# Patient Record
Sex: Male | Born: 1960 | Race: Black or African American | Hispanic: No | Marital: Single | Smoking: Never smoker
Health system: Southern US, Community
[De-identification: ages and names within clinical notes are randomized; demographics above are authoritative.]

## PROBLEM LIST (undated history)

## (undated) DIAGNOSIS — J45909 Unspecified asthma, uncomplicated: Secondary | ICD-10-CM

## (undated) DIAGNOSIS — I429 Cardiomyopathy, unspecified: Secondary | ICD-10-CM

## (undated) DIAGNOSIS — I10 Essential (primary) hypertension: Secondary | ICD-10-CM

---

## 2016-01-25 ENCOUNTER — Encounter (HOSPITAL_COMMUNITY): Payer: Self-pay

## 2016-01-25 ENCOUNTER — Emergency Department (HOSPITAL_COMMUNITY)

## 2016-01-25 ENCOUNTER — Observation Stay (HOSPITAL_COMMUNITY)
Admission: EM | Admit: 2016-01-25 | Discharge: 2016-01-27 | Disposition: A | Discharge status: Home / Self Care | Attending: Internal Medicine | Admitting: Internal Medicine

## 2016-01-25 DIAGNOSIS — R079 Chest pain, unspecified: Secondary | ICD-10-CM | POA: Diagnosis present

## 2016-01-25 DIAGNOSIS — I209 Angina pectoris, unspecified: Principal | ICD-10-CM | POA: Insufficient documentation

## 2016-01-25 DIAGNOSIS — I10 Essential (primary) hypertension: Secondary | ICD-10-CM | POA: Insufficient documentation

## 2016-01-25 DIAGNOSIS — R778 Other specified abnormalities of plasma proteins: Secondary | ICD-10-CM

## 2016-01-25 DIAGNOSIS — J45909 Unspecified asthma, uncomplicated: Secondary | ICD-10-CM | POA: Diagnosis not present

## 2016-01-25 DIAGNOSIS — R7989 Other specified abnormal findings of blood chemistry: Secondary | ICD-10-CM | POA: Diagnosis not present

## 2016-01-25 HISTORY — DX: Unspecified asthma, uncomplicated: J45.909

## 2016-01-25 HISTORY — DX: Cardiomyopathy, unspecified: I42.9

## 2016-01-25 HISTORY — DX: Essential (primary) hypertension: I10

## 2016-01-25 LAB — CBC
HEMATOCRIT: 46.3 % (ref 39.0–52.0)
Hemoglobin: 15.1 g/dL (ref 13.0–17.0)
MCH: 27.5 pg (ref 26.0–34.0)
MCHC: 32.6 g/dL (ref 30.0–36.0)
MCV: 84.2 fL (ref 78.0–100.0)
Platelets: 159 10*3/uL (ref 150–400)
RBC: 5.5 MIL/uL (ref 4.22–5.81)
RDW: 13.8 % (ref 11.5–15.5)
WBC: 4.8 10*3/uL (ref 4.0–10.5)

## 2016-01-25 LAB — COMPREHENSIVE METABOLIC PANEL
ALT: 17 U/L (ref 17–63)
AST: 25 U/L (ref 15–41)
Albumin: 4.5 g/dL (ref 3.5–5.0)
Alkaline Phosphatase: 46 U/L (ref 38–126)
Anion gap: 6 (ref 5–15)
BILIRUBIN TOTAL: 0.5 mg/dL (ref 0.3–1.2)
BUN: 17 mg/dL (ref 6–20)
CO2: 28 mmol/L (ref 22–32)
CREATININE: 1.29 mg/dL — AB (ref 0.61–1.24)
Calcium: 9.4 mg/dL (ref 8.9–10.3)
Chloride: 105 mmol/L (ref 101–111)
GFR calc Af Amer: >60 mL/min (ref >60)
Glucose, Bld: 97 mg/dL (ref 65–99)
Potassium: 4.6 mmol/L (ref 3.5–5.1)
Sodium: 139 mmol/L (ref 135–145)
TOTAL PROTEIN: 7.2 g/dL (ref 6.5–8.1)

## 2016-01-25 LAB — TROPONIN I: TROPONIN I: 0.08 ng/mL — AB (ref <0.031)

## 2016-01-25 MED ORDER — NITROGLYCERIN 0.4 MG SL SUBL
0.4000 mg | SUBLINGUAL_TABLET | Freq: Once | SUBLINGUAL | Status: AC
  Administered 2016-01-25: 0.4 mg via SUBLINGUAL
  Filled 2016-01-25: qty 1

## 2016-01-25 MED ORDER — NITROGLYCERIN 0.4 MG SL SUBL: 0.4000 mg | SUBLINGUAL_TABLET | Freq: Once | SUBLINGUAL | Status: DC

## 2016-01-25 MED ORDER — FENTANYL CITRATE (PF) 100 MCG/2ML IJ SOLN
100.0000 ug | Freq: Once | INTRAMUSCULAR | Status: AC
  Administered 2016-01-25: 100 ug via INTRAVENOUS
  Filled 2016-01-25: qty 2

## 2016-01-25 NOTE — H&P (Signed)
PCP:   No PCP Per Patient   Chief Complaint:  Chest pain  HPI: 55 year old male who   has a past medical history of Hypertension; Cardiomyopathy (HCC); and Asthma. patient is currently in prison. Today was brought to the hospital for chest pain which started on Wednesday. Patient says that the pain was continuous and this morning it became worse and felt like somebody was sitting on the chest. He also reported radiation to the left arm. Denies nausea vomiting or diarrhea. No shortness of breath. In the ED pain relieved after patient got fentanyl. Denies fever, no dysuria or urgency frequency of urination. Patient says he has a history of cardiopathy also had cardiac cath done 10 years ago which was normal. In the ED he was found to have mild elevation of troponin 0.08, cardiology fellow at [facility] was consulted by the ED physician. And he recommended to keep the patient at [facility] at this time. No heparin or Lovenox recommended.  Allergies:  No Known Allergies    Past Medical History  Diagnosis Date  . Hypertension   . Cardiomyopathy (HCC)   . Asthma     History reviewed. No pertinent past surgical history.  Prior to Admission medications   Medication Sig Start Date End Date Taking? Authorizing Provider  albuterol (PROVENTIL HFA;VENTOLIN HFA) 108 (90 Base) MCG/ACT inhaler Inhale 1 puff into the lungs every 6 (six) hours as needed for wheezing or shortness of breath.   Yes Historical Provider, MD  amLODipine (NORVASC) 5 MG tablet Take 5 mg by mouth daily.   Yes Historical Provider, MD  beclomethasone (QVAR) 40 MCG/ACT inhaler Inhale 1 puff into the lungs 2 (two) times daily.   Yes Historical Provider, MD    Social History:  reports that he has never smoked. He does not have any smokeless tobacco history on file. He reports that he does not drink alcohol or use illicit drugs.  Patient's mother and sister has history of cancer. No history of CAD in family  Filed  Weights   01/25/16 2008  Weight: 88.451 kg (195 lb)    All the positives are listed in BOLD  Review of Systems:  HEENT: Headache, blurred vision, runny nose, sore throat Neck: Hypothyroidism, hyperthyroidism,,lymphadenopathy Chest : Shortness of breath, history of COPD, Asthma Heart : Chest pain, history of coronary arterey disease GI:  Nausea, vomiting, diarrhea, constipation, GERD GU: Dysuria, urgency, frequency of urination, hematuria Neuro: Stroke, seizures, syncope Psych: Depression, anxiety, hallucinations   Physical Exam: Blood pressure 130/88, pulse 52, temperature 97.8 F (36.6 C), temperature source Oral, resp. rate 13, height  (1.753 m), weight 88.451 kg (195 lb), SpO2 99 %. Constitutional:   Patient is a well-developed and well-nourished male in no acute distress and cooperative with exam. Head: Normocephalic and atraumatic Mouth: Mucus membranes moist Eyes: PERRL, EOMI, conjunctivae normal Neck: Supple, No Thyromegaly Cardiovascular: RRR, S1 normal, S2 normal Pulmonary/Chest: CTAB, no wheezes, rales, or rhonchi Abdominal: Soft. Non-tender, non-distended, bowel sounds are normal, no masses, organomegaly, or guarding present.  Neurological: A&O x3, Strength is normal and symmetric bilaterally, cranial nerve II-XII are grossly intact, no focal motor deficit, sensory intact to light touch bilaterally.  Extremities : No Cyanosis, Clubbing or Edema  Labs on Admission:  Basic Metabolic Panel:  Recent Labs Lab 01/25/16 2020  NA 139  K 4.6  CL 105  CO2 28  GLUCOSE 97  BUN 17  CREATININE 1.29*  CALCIUM 9.4   Liver Function Tests:  Recent Labs Lab 01/25/16 2020  AST 25  ALT 17  ALKPHOS 46  BILITOT 0.5  PROT 7.2  ALBUMIN 4.5    CBC:  Recent Labs Lab 01/25/16 2020  WBC 4.8  HGB 15.1  HCT 46.3  MCV 84.2  PLT 159   Cardiac Enzymes:  Recent Labs Lab 01/25/16 2020  TROPONINI 0.08*     Radiological Exams on Admission: Dg Chest 2  View  01/25/2016  CLINICAL DATA:  Chest pain EXAM: CHEST  2 VIEW COMPARISON:  None. FINDINGS: The lungs are clear wiithout focal pneumonia, edema, pneumothorax or pleural effusion. Cardiopericardial silhouette is at upper limits of normal for size. The visualized bony structures of the thorax are intact. Telemetry leads overlie the chest. IMPRESSION: No active cardiopulmonary disease. Electronically Signed   By: Kennith CenterEric  Mansell M.D.   On: 01/25/2016 21:07    EKG: Independently reviewed. P-wave inversions in 2, 3 aVF, V4 V5 V6   Assessment/Plan Active Problems:   Chest pain   Chest pain Admit the patient under observation in telemetry Cycle cardiac enzymes every 6 hours 3 Start Nitropaste half-inch every 6 hours Consult cardiology in a.m. Start aspirin 325 mg by mouth daily Start morphine when necessary for pain  DVT prophylaxis Lovenox  Code status: Full code  Family discussion: No family at bedside   Time Spent on Admission: 60 min  [facility],Liann Spaeth S [facility] Pager: [phone] 01/25/2016, 11:33 PM  If 7PM-7AM, please contact night-coverage  [email or web address]  Password [email or web address]

## 2016-01-25 NOTE — ED Notes (Signed)
[facility] that started Wednesday. Patient reports of pain in left chest wall that radiates into left upper arm. EMS states patient was given 324 of baby ASA and 2 Nitro with no relief.

## 2016-01-25 NOTE — ED Notes (Signed)
MD at bedside. 

## 2016-01-25 NOTE — ED Provider Notes (Signed)
CSN: [id number]     Arrival date & time 01/25/16  2003 History  By signing my name below, I, Marisue Humble, attest that this documentation has been prepared under the direction and in the presence of Lavera Guise, MD . Electronically Signed: Marisue Humble, Scribe. 01/25/2016. 8:46 PM.   Chief Complaint  Patient presents with  . Chest Pain   The history is provided by the patient. No language interpreter was used.   HPI Comments:  Jeffrey Petersen is a 55 y.o. male with PMHx of cardiomyopathy, HTN, and ashtma who presents to the Emergency Department via EMS complaining of gradual onset, intermittent, moderate chest pain for the past 5 days, worsening yesterday. He states he woke up this morning feeling like someone was sitting on his chest. Pt also reports radiation down his left arm feeling like needles. He also notes he feels like there's fluid around his heart. He states he doesn't have any difficulties when exercising, but hasn't been moving around for the past few days because he has been in solitary. Pt notes pain is improved slightly when sitting up. Pt denies h/o smoking or FHx of heart problems. Denies nausea, dizziness, shortness of breath, diaphoresis, vomiting, diarrhea, abdominal pain, cough, fever, leg swelling, or leg pain.  Past Medical History  Diagnosis Date  . Hypertension   . Cardiomyopathy (HCC)   . Asthma    History reviewed. No pertinent past surgical history. History reviewed. No pertinent family history. Social History  Substance Use Topics  . Smoking status: Never Smoker   . Smokeless tobacco: None  . Alcohol Use: No    Review of Systems  Constitutional: Negative for fever and diaphoresis.  Respiratory: Negative for cough and shortness of breath.   Cardiovascular: Positive for chest pain. Negative for leg swelling.  Gastrointestinal: Negative for nausea, vomiting, abdominal pain and diarrhea.  Musculoskeletal: Positive for arthralgias (left arm).   Neurological: Negative for dizziness.  All other systems reviewed and are negative.  Allergies  Review of patient's allergies indicates no known allergies.  Home Medications   Prior to Admission medications   Medication Sig Start Date End Date Taking? Authorizing Provider  albuterol (PROVENTIL HFA;VENTOLIN HFA) 108 (90 Base) MCG/ACT inhaler Inhale 1 puff into the lungs every 6 (six) hours as needed for wheezing or shortness of breath.   Yes Historical Provider, MD  amLODipine (NORVASC) 5 MG tablet Take 5 mg by mouth daily.   Yes Historical Provider, MD  beclomethasone (QVAR) 40 MCG/ACT inhaler Inhale 1 puff into the lungs 2 (two) times daily.   Yes Historical Provider, MD   BP 128/84 mmHg  Pulse 58  Temp(Src) 97.8 F (36.6 C) (Oral)  Resp 18  Ht  (1.753 m)  Wt 195 lb (88.451 kg)  BMI 28.78 kg/m2  SpO2 97%   Physical Exam Physical Exam  Nursing note and vitals reviewed. Constitutional: Well developed, well nourished, non-toxic, and in no acute distress Head: Normocephalic and atraumatic.  Mouth/Throat: Oropharynx is clear and moist.  Neck: Normal range of motion. Neck supple.  Cardiovascular: Normal rate and regular rhythm.    Pulmonary/Chest: Effort normal and breath sounds normal.  Abdominal: Soft. There is no tenderness. There is no rebound and no guarding.  Musculoskeletal: Normal range of motion. No LE edema. Neurological: Alert, no facial droop, fluent speech, moves all extremities symmetrically Skin: Skin is warm and dry.  Psychiatric: Cooperative  ED Course  Procedures  DIAGNOSTIC STUDIES: Oxygen Saturation is 99% on RA, normal by my  interpretation.    COORDINATION OF CARE: 8:27 PM Will order chest x-ray and blood work. Discussed treatment plan with pt at bedside and pt agreed to plan.  Labs Review Labs Reviewed  COMPREHENSIVE METABOLIC PANEL - Abnormal; Notable for the following:    Creatinine, Ser 1.29 (*)    All other components within normal limits   TROPONIN I - Abnormal; Notable for the following:    Troponin I 0.08 (*)    All other components within normal limits  CBC    Imaging Review Dg Chest 2 View  01/25/2016  CLINICAL DATA:  Chest pain EXAM: CHEST  2 VIEW COMPARISON:  None. FINDINGS: The lungs are clear wiithout focal pneumonia, edema, pneumothorax or pleural effusion. Cardiopericardial silhouette is at upper limits of normal for size. The visualized bony structures of the thorax are intact. Telemetry leads overlie the chest. IMPRESSION: No active cardiopulmonary disease. Electronically Signed   By: Kennith CenterEric  Mansell M.D.   On: 01/25/2016 21:07   I have personally reviewed and evaluated these images and lab results as part of my medical decision-making.   EKG Interpretation   Date/Time:  Sunday January 25 2016 20:07:49 EDT Ventricular Rate:  58 PR Interval:  220 QRS Duration: 110 QT Interval:  436 QTC Calculation: 428 R Axis:   117 Text Interpretation:  Sinus rhythm Prolonged PR interval LAE, consider  biatrial enlargement Probable RVH w/ secondary repol abnormality Inferior  infarct, age indeterminate Abnormal lateral Q waves No old tracing to  compare Confirmed by [facility]  MD, ELLIOTT [phone]([id number]) on 01/25/2016 8:15:38 PM      MDM   Final diagnoses:  Ischemic chest pain (HCC)  Troponin level elevated   55 year old male with history of hypertrophic cardiomyopathy and hypertension who presents with chest pressure. On arrival had received a full dose of aspirin and 2 sublingual nitroglycerin with persistent chest pressure. His vital signs are stable. His EKG shows Q waves in the inferolateral leads with T-wave inversions inferolaterally. This may be consistent with hypertrophic cardiomyopathy, and he states that he's been told that he has had these EKG findings in the past. No prior EKG for comparison in our system. His cardiopulmonary exam is unremarkable and the remainder of exam is also unremarkable. He has negative chest x-ray.  Troponin is mildly elevated at 0.08. Concern for ischemic etiology of chest pain. His chest pain did fully resolve after third dose of nitroglycerin with 100 g of fentanyl. [facility] T[facility] End[facility]Kelly [facility], who [facility] for serial troponins and cardiology consult. Did not recommend heparin drip at this time. Subsequently discussed with Dr. Sharl MaLama will admit for ongoing management.  I personally performed the services described in this documentation, which was scribed in my presence. The recorded information has been reviewed and is accurate.    Lavera Guiseana Duo Monice Lundy, MD 01/26/16 [phone]

## 2016-01-25 NOTE — ED Notes (Signed)
Returned from xray

## 2016-01-25 NOTE — ED Notes (Signed)
Coke given to patient as requested by patient.

## 2016-01-26 ENCOUNTER — Observation Stay (HOSPITAL_BASED_OUTPATIENT_CLINIC_OR_DEPARTMENT_OTHER)

## 2016-01-26 ENCOUNTER — Encounter (HOSPITAL_COMMUNITY): Payer: Self-pay | Admitting: *Deleted

## 2016-01-26 DIAGNOSIS — I10 Essential (primary) hypertension: Secondary | ICD-10-CM | POA: Diagnosis not present

## 2016-01-26 DIAGNOSIS — R079 Chest pain, unspecified: Secondary | ICD-10-CM

## 2016-01-26 LAB — TROPONIN I
TROPONIN I: 0.08 ng/mL — AB (ref <0.031)
TROPONIN I: 0.07 ng/mL — AB (ref <0.031)
TROPONIN I: 0.07 ng/mL — AB (ref <0.031)

## 2016-01-26 LAB — BRAIN NATRIURETIC PEPTIDE: B Natriuretic Peptide: 65 pg/mL (ref 0.0–100.0)

## 2016-01-26 LAB — ECHOCARDIOGRAM COMPLETE
Height: 66 in
Weight: 3068.8 oz

## 2016-01-26 LAB — MRSA PCR SCREENING: MRSA by PCR: NEGATIVE

## 2016-01-26 LAB — D-DIMER, QUANTITATIVE (NOT AT ARMC): D DIMER QUANT: 0.38 ug{FEU}/mL (ref 0.00–0.50)

## 2016-01-26 MED ORDER — ASPIRIN EC 325 MG PO TBEC
325.0000 mg | DELAYED_RELEASE_TABLET | Freq: Every day | ORAL | Status: DC
  Administered 2016-01-26 – 2016-01-27 (×2): 325 mg via ORAL
  Filled 2016-01-26 (×2): qty 1

## 2016-01-26 MED ORDER — BUDESONIDE 0.25 MG/2ML IN SUSP
0.2500 mg | Freq: Two times a day (BID) | RESPIRATORY_TRACT | Status: DC
  Administered 2016-01-26 – 2016-01-27 (×4): 0.25 mg via RESPIRATORY_TRACT
  Filled 2016-01-26 (×4): qty 2

## 2016-01-26 MED ORDER — BECLOMETHASONE DIPROPIONATE 40 MCG/ACT IN AERS
1.0000 | INHALATION_SPRAY | Freq: Two times a day (BID) | RESPIRATORY_TRACT | Status: DC
  Filled 2016-01-26: qty 8.7

## 2016-01-26 MED ORDER — ACETAMINOPHEN 325 MG PO TABS
650.0000 mg | ORAL_TABLET | ORAL | Status: DC | PRN
  Administered 2016-01-26 – 2016-01-27 (×4): 650 mg via ORAL
  Filled 2016-01-26 (×4): qty 2

## 2016-01-26 MED ORDER — BECLOMETHASONE DIPROPIONATE 40 MCG/ACT IN AERS: 1.0000 | INHALATION_SPRAY | Freq: Two times a day (BID) | RESPIRATORY_TRACT | Status: DC

## 2016-01-26 MED ORDER — ONDANSETRON HCL 4 MG/2ML IJ SOLN: 4.0000 mg | Freq: Four times a day (QID) | INTRAMUSCULAR | Status: DC | PRN

## 2016-01-26 MED ORDER — NITROGLYCERIN 2 % TD OINT
0.5000 [in_us] | TOPICAL_OINTMENT | Freq: Four times a day (QID) | TRANSDERMAL | Status: DC
  Administered 2016-01-26 – 2016-01-27 (×6): 0.5 [in_us] via TOPICAL
  Filled 2016-01-26 (×5): qty 1

## 2016-01-26 MED ORDER — MORPHINE SULFATE (PF) 2 MG/ML IV SOLN
2.0000 mg | INTRAVENOUS | Status: DC | PRN
Start: 2016-01-26 — End: 2016-01-27
  Administered 2016-01-26: 2 mg via INTRAVENOUS
  Filled 2016-01-26: qty 1

## 2016-01-26 MED ORDER — ENOXAPARIN SODIUM 40 MG/0.4ML ~~LOC~~ SOLN
40.0000 mg | SUBCUTANEOUS | Status: DC
  Administered 2016-01-26 – 2016-01-27 (×2): 40 mg via SUBCUTANEOUS
  Filled 2016-01-26 (×2): qty 0.4

## 2016-01-26 MED ORDER — AMLODIPINE BESYLATE 5 MG PO TABS
5.0000 mg | ORAL_TABLET | Freq: Every day | ORAL | Status: DC
  Administered 2016-01-26 – 2016-01-27 (×2): 5 mg via ORAL
  Filled 2016-01-26 (×2): qty 1

## 2016-01-26 NOTE — Progress Notes (Signed)
[facility] PROGRESS NOTE  Cathren HarshClark XXXWitherspoon NWG:[id number] DOB: 1961/02/03 DOA: 01/25/2016 PCP: No PCP Per Patient  Assessment/Plan: Chest Pain -CP is somewhat atypical. He does have inferior q waves and lateral ST depressions on EKG. -He states he has a h/o HTN and maybe obstructive cardiomyopathy. -ECHO pending. -Cardiology following and they will determine if further testing is required.  HTN -Well controlled on norvasc.  H/o Asthma -Does not appear to be exacerbated at this time.    Code Status: Full Code Family Communication: Patient only  Disposition Plan: to be determined   Consultants:  Cardiology   Antibiotics:  None   Subjective: Had some chest discomfort today  Objective: Filed Vitals:   01/26/16 0550 01/26/16 0812 01/26/16 1151 01/26/16 1500  BP: 128/82  122/67 136/70  Pulse: 61  67 61  Temp: 97.5 F (36.4 C)  98.6 F (37 C) 98.7 F (37.1 C)  TempSrc: Oral  Oral Oral  Resp: 18  18 18   Height:      Weight:      SpO2: 97% 96% 100% 99%    Intake/Output Summary (Last 24 hours) at 01/26/16 1703 Last data filed at 01/26/16 1230  Gross per 24 hour  Intake    480 ml  Output      0 ml  Net    480 ml   Filed Weights   01/25/16 2008 01/26/16 0021  Weight: 88.451 kg (195 lb) 87 kg (191 lb 12.8 oz)    Exam:   General:  AA Ox3  Cardiovascular: RRR  Respiratory: CTA B  Abdomen: S/NT/ND/+BS  Extremities: no C/C/E   Neurologic:  Non-focal  Data Reviewed: Basic Metabolic Panel:  Recent Labs Lab 01/25/16 2020  NA 139  K 4.6  CL 105  CO2 28  GLUCOSE 97  BUN 17  CREATININE 1.29*  CALCIUM 9.4   Liver Function Tests:  Recent Labs Lab 01/25/16 2020  AST 25  ALT 17  ALKPHOS 46  BILITOT 0.5  PROT 7.2  ALBUMIN 4.5   No results for input(s): LIPASE, AMYLASE in the last 168 hours. No results for input(s): AMMONIA in the last 168 hours. CBC:  Recent Labs Lab 01/25/16 2020  WBC 4.8  HGB 15.1  HCT 46.3    MCV 84.2  PLT 159   Cardiac Enzymes:  Recent Labs Lab 01/25/16 2020 01/26/16 0151 01/26/16 0654 01/26/16 1325  TROPONINI 0.08* 0.08* 0.07* 0.07*   BNP (last 3 results) No results for input(s): BNP in the last 8760 hours.  ProBNP (last 3 results) No results for input(s): PROBNP in the last 8760 hours.  CBG: No results for input(s): GLUCAP in the last 168 hours.  Recent Results (from the past 240 hour(s))  MRSA PCR Screening     Status: None   Collection Time: 01/26/16  1:22 AM  Result Value Ref Range Status   MRSA by PCR NEGATIVE NEGATIVE Final    Comment:        The GeneXpert MRSA Assay (FDA approved for NASAL specimens only), is one component of a comprehensive MRSA colonization surveillance program. It is not intended to diagnose MRSA infection nor to guide or monitor treatment for MRSA infections.      Studies: Dg Chest 2 View  01/25/2016  CLINICAL DATA:  Chest pain EXAM: CHEST  2 VIEW COMPARISON:  None. FINDINGS: The lungs are clear wiithout focal pneumonia, edema, pneumothorax or pleural effusion. Cardiopericardial silhouette is at upper limits of normal for size. The visualized bony  structures of the thorax are intact. Telemetry leads overlie the chest. IMPRESSION: No active cardiopulmonary disease. Electronically Signed   By: Kennith Center M.D.   On: 01/25/2016 21:07    Scheduled Meds: . amLODipine  5 mg Oral Daily  . aspirin EC  325 mg Oral Daily  . budesonide (PULMICORT) nebulizer solution  0.25 mg Nebulization BID  . enoxaparin (LOVENOX) injection  40 mg Subcutaneous Q24H  . nitroGLYCERIN  0.5 inch Topical 4 times per day  . nitroGLYCERIN  0.4 mg Sublingual Once   Continuous Infusions:   Active Problems:   Chest pain    Time spent: 25 minutes. Greater than 50% of this time was spent in direct contact with the patient coordinating care.    Chaya Jan  [facility] Pager [phone]  If 7PM-7AM, please contact night-coverage  at [email or web address], password [facility] 01/26/2016, 5:03 PM

## 2016-01-26 NOTE — Consult Note (Signed)
CARDIOLOGY CONSULT NOTE   Patient ID: Jeffrey Petersen MRN: [id number] DOB/AGE: [birth year] 54 y.o.  Admit Date: 01/25/2016 Referring Physician: [facility]-Hernandez Primary Physician: No PCP Per Patient Consulting Cardiologist: Jeffrey Pates  Primary Cardiologist New Reason for Consultation: Chest Pain with elevated troponin   Clinical Summary Jeffrey Petersen is a 55 y.o.male with known history of hypertension, hypertensive cardiomyopathy, and Asthma presented to the ER with complaints of chest pain. He has been previously seen by [facility] for his cardiomyopathy but not since 2005. He is currently incarcerated.   He states on Wednesday of last week he woke up and had some chest pressure. The pressure was constant without associated dyspnea, NV, diaphoresis or dizziness. He ignored it. By Sunday, 3.12.2017, the pressure became worse with a tightness or squeezing feeling over the left chest. One episode of left arm pain which was brief. He reported to the guards and nurse who felt he should be seen in ER.   On arrival to ER, BP 117/75, HR 56, O2 Sat 99%, afebrile. Labs demonstrate slight elevation in creatinine of 1.29, troponin of 0.08, CXR negative for CHF or pneumonia. EKG demonstrated NSR with Q-waves inferiorly and ST inversion laterally. Subsequent troponin levels 0.08;0.08; 0.07.   Currently he still has mild chest pressure    No Known Allergies  Medications Scheduled Medications: . amLODipine  5 mg Oral Daily  . aspirin EC  325 mg Oral Daily  . budesonide (PULMICORT) nebulizer solution  0.25 mg Nebulization BID  . enoxaparin (LOVENOX) injection  40 mg Subcutaneous Q24H  . nitroGLYCERIN  0.5 inch Topical 4 times per day  . nitroGLYCERIN  0.4 mg Sublingual Once    Infusions:    PRN Medications: acetaminophen, morphine injection, ondansetron (ZOFRAN) IV   Past Medical History  Diagnosis Date  . Hypertension   . Cardiomyopathy (HCC)   . Asthma     History reviewed. No  pertinent past surgical history.  Family History  Problem Relation Age of Onset  . Hypertension Mother   . Hypertension Father     Social History Jeffrey Petersen reports that he has never smoked. He does not have any smokeless tobacco history on file. Jeffrey Petersen reports that he does not drink alcohol.  Review of Systems Complete review of systems are found to be negative unless outlined in H&P above.  Physical Examination Blood pressure 128/82, pulse 61, temperature 97.5 F (36.4 C), temperature source Oral, resp. rate 18, height  (1.676 m), weight 191 lb 12.8 oz (87 kg), SpO2 96 %. No intake or output data in the 24 hours ending 01/26/16 1047  Telemetry: NSR   GEN: No acute distress but continues complaints of chest pressure HEENT: Conjunctiva and lids normal, oropharynx clear with moist mucosa. Neck: Supple, no elevated JVP or carotid bruits, no thyromegaly. Lungs: Clear to auscultation, nonlabored breathing at rest. Cardiac: Regular rate and rhythm, 1/6 soft systolic murmur,  no S3 or significant systolic murmur, no pericardial rub. Chest  Nontender Abdomen: Soft, nontender, no hepatomegaly, bowel sounds present, no guarding or rebound. Extremities: No pitting edema, distal pulses 2+. Skin: Warm and dry. Musculoskeletal: No kyphosis. Neuropsychiatric: Alert and oriented x3, affect grossly appropriate.  Prior Cardiac Testing/Procedures Has been followed at [facility] and had cardiac cath, echo and stress test per patient. ([facility] is not responding).  Lab Results  Basic Metabolic Panel:  Recent Labs Lab 01/25/16 2020  NA 139  K 4.6  CL 105  CO2 28  GLUCOSE 97  BUN  17  CREATININE 1.29*  CALCIUM 9.4    Liver Function Tests:  Recent Labs Lab 01/25/16 2020  AST 25  ALT 17  ALKPHOS 46  BILITOT 0.5  PROT 7.2  ALBUMIN 4.5    CBC:  Recent Labs Lab 01/25/16 2020  WBC 4.8  HGB 15.1  HCT 46.3  MCV 84.2  PLT 159    Cardiac  Enzymes:  Recent Labs Lab 01/25/16 2020 01/26/16 0151 01/26/16 0654  TROPONINI 0.08* 0.08* 0.07*    Radiology: Dg Chest 2 View  01/25/2016  CLINICAL DATA:  Chest pain EXAM: CHEST  2 VIEW COMPARISON:  None. FINDINGS: The lungs are clear wiithout focal pneumonia, edema, pneumothorax or pleural effusion. Cardiopericardial silhouette is at upper limits of normal for size. The visualized bony structures of the thorax are intact. Telemetry leads overlie the chest. IMPRESSION: No active cardiopulmonary disease. Electronically Signed   By: Kennith CenterEric  Mansell M.D.   On: 01/25/2016 21:07     ECG: SB  58 bpm  First degree AV block  PR 228 msec.  T wave inversion in the inferior and anterolateral leads  No old EKGs to compare.    Impression and Recommendations 1. Chest Pressure: Atypical for coronary ischemia. Constant, without associated dyspnea, diaphoresis, or weakness. This occurred at rest, and was not exacerbated with activity. EKG demonstrates inferior Q-waves with T-wave inversion and repolarization abnormalities in the lateral leads. Will have echocardiogram completed to evaluate for LV systolic function. Troponin minimally elevated.  . Awaiting records from [facility] to download for review of past testing. Based on review of above will plan other testing  ? GI though pt says that reflux is different WOuld also check D dimer and BNP    2. Hypertension:  Currently well controlled on amlodipine. Continue this.   3. Hx of Asthma: On Qvar. NO wheezing on exam    Signed: Bettey MareKathryn M. Lawrence NP [facility]  01/26/2016, 10:47 AM Co-Sign MD  Pt seen and examined  I have amended note above by Harriet PhoK Lawrence to reflect my findings  On exam  Lungs CTA  Cardiac RRR  Gr I/VI systolic murmur LSB  Ext no edema CP is atypical  For coronary ischemia Will review echo Need to get records from [facility] Would not sched further testing for now.   Jeffrey Petersen  01/26/2016

## 2016-01-27 DIAGNOSIS — I422 Other hypertrophic cardiomyopathy: Secondary | ICD-10-CM | POA: Diagnosis not present

## 2016-01-27 DIAGNOSIS — R079 Chest pain, unspecified: Secondary | ICD-10-CM | POA: Diagnosis not present

## 2016-01-27 DIAGNOSIS — R7989 Other specified abnormal findings of blood chemistry: Secondary | ICD-10-CM

## 2016-01-27 MED ORDER — RANITIDINE HCL 150 MG PO TABS: 150.0000 mg | ORAL_TABLET | Freq: Two times a day (BID) | ORAL | Status: AC

## 2016-01-27 MED ORDER — ALBUTEROL SULFATE (2.5 MG/3ML) 0.083% IN NEBU
2.5000 mg | INHALATION_SOLUTION | RESPIRATORY_TRACT | Status: DC | PRN
  Administered 2016-01-27: 2.5 mg via RESPIRATORY_TRACT
  Filled 2016-01-27: qty 3

## 2016-01-27 NOTE — Progress Notes (Signed)
Primary Cardi[facility]  [facility]  Cardiology Specific Problem List: 1. Chest Pain 2. Hx of Hypertrophic cardiomypathy   Subjective:    Feeling better. Chest pressure improved. Upper airway wheezes this morning.  Objective:   Temp:  [98.2 F (36.8 C)-98.7 F (37.1 C)] 98.4 F (36.9 C) (03/14 0500) Pulse Rate:  [61-71] 63 (03/14 0500) Resp:  [18] 18 (03/14 0500) BP: (110-136)/(54-81) 110/54 mmHg (03/14 0500) SpO2:  [97 %-100 %] 97 % (03/14 1014) Last BM Date: 01/26/16  [facility]   01/25/16 2008 01/26/16 0021  Weight: 195 lb (88.451 kg) 191 lb 12.8 oz (87 kg)    Intake/Output Summary (Last 24 hours) at 01/27/16 1154 Last data filed at 01/27/16 0930  Gross per 24 hour  Intake    840 ml  Output      0 ml  Net    840 ml    Telemetry: NSR with rare PVC's.  Exam:  General: Well developed male, no acute distress.  Lungs: Clear to auscultation, nonlabored.  Cardiac: No elevated JVP or bruits. RRR, no gallop or rub.   Abdomen: Normoactive bowel sounds, nontender, nondistended.  Extremities: No pitting edema, distal pulses full.  Neuropsychiatric: Alert and oriented x3, affect appropriate.  Echocardiogram 01/26/2016 Left ventricle: The cavity size was normal. Wall thickness was  increased in a pattern of severe LVH (17 mm). Systolic function was  vigorous. The estimated ejection fraction was in the range of 65%  to 70%. Doppler parameters are consistent with abnormal left  ventricular relaxation (grade 1 diastolic dysfunction).  Lab Results:  Basic Metabolic Panel:  Recent Labs Lab 01/25/16 2020  NA 139  K 4.6  CL 105  CO2 28  GLUCOSE 97  BUN 17  CREATININE 1.29*  CALCIUM 9.4    Liver Function Tests:  Recent Labs Lab 01/25/16 2020  AST 25  ALT 17  ALKPHOS 46  BILITOT 0.5  PROT 7.2  ALBUMIN 4.5    CBC:  Recent Labs Lab 01/25/16 2020  WBC 4.8  HGB 15.1  HCT 46.3  MCV 84.2  PLT 159    Cardiac Enzymes:  Recent Labs Lab  01/26/16 0151 01/26/16 0654 01/26/16 1325  TROPONINI 0.08* 0.07* 0.07*    Radiology: Dg Chest 2 View  01/25/2016  CLINICAL DATA:  Chest pain EXAM: CHEST  2 VIEW COMPARISON:  None. FINDINGS: The lungs are clear wiithout focal pneumonia, edema, pneumothorax or pleural effusion. Cardiopericardial silhouette is at upper limits of normal for size. The visualized bony structures of the thorax are intact. Telemetry leads overlie the chest. IMPRESSION: No active cardiopulmonary disease. Electronically Signed   By: Kennith CenterEric  Mansell M.D.   On: 01/25/2016 21:07     Medications:   Scheduled Medications: . amLODipine  5 mg Oral Daily  . aspirin EC  325 mg Oral Daily  . budesonide (PULMICORT) nebulizer solution  0.25 mg Nebulization BID  . enoxaparin (LOVENOX) injection  40 mg Subcutaneous Q24H  . nitroGLYCERIN  0.4 mg Sublingual Once    PRN Medications: acetaminophen, albuterol, morphine injection, ondansetron (ZOFRAN) IV   Assessment and Plan:   1. Atypical chest pain: Improved but not eliminated. Troponin I is minimally elevated and flat, not consistent with ACS. Follow-up echocardiogram shows severe LVH, no gradient or valvular abnormalities. No plans for ischemic testing at this time. [facility], these were requested again today. He has had some upper airway wheezing, consider pulmonary component to symptoms as  well. Chest x-ray without acute findings.  2. Hypertrophic cardiomyopathy by history: Echocardiogram demonstrated severe concentric LVH (17 mm) and grade I diastolic dysfunction. No gradient noted. Blood pressure has remained stable. Continue amlodipine 5 mg daily. He recalls being on beta blocker and 1. calcium channel blocker in the past, but these were all discontinued over time. Possibly related to slow heart rates.  Bettey Mare. Lawrence NP [facility]  01/27/2016, 11:54 AM    Attending note:  Patient seen and examined.  Reviewed available information which is currently limited. Reviewed consult note from Dr. Tenny Craw and modified above note by Ms. Lawrence NP. Jeffrey Petersen is admitted with atypical, prolonged chest discomfort, also some recent upper airway wheezing. His chest x-ray shows no acute findings. He reports a history of hypertrophic cardiomyopathy that has been [facility]. He states that he underwent cardiac testing about 5 years ago including a reassuring cardiac catheterization, and that he has had prior documentation of abnormal cardiac enzymes. He is currently incarcerated. His follow-up echocardiogram shows severe concentric LV hypertrophy (17 mm) without obvious gradient and normal LVEF. He has mild diastolic dysfunction. Troponin I levels are minimally increased at 0.08 and 0.07, flat pattern, not consistent with ACS. BNP is also normal.  Examination this morning he appears comfortable, has mild upper airway wheezing. Blood pressure is 110/54, heart rate 63 and sinus rhythm. He has had no significant arrhythmias on telemetry. Cardiac exam reveals a very faint flow murmur not accentuated by position change, lungs are otherwise clear with the exception of the mild wheezes noted.   Patient with evidence of possible hypertrophic cardiomyopathy with severe concentric LVH of 17 mm. He does have a history of hypertension, but it does not sound like this has been poorly controlled necessarily. He reports prior cardiac evaluations at [facility] and also [facility] system, most recently [facility]. We have requested records to validate this information, these are pending. At this point no further cardiac testing is planned. Would continue Norvasc, not adding beta blocker at this point, and he actually recalls being taken off of beta blocker in the past. He is to keep regular cardiology follow-up with [facility] as before.  Jonelle Sidle, M.D., F.A.C.C.

## 2016-01-27 NOTE — Discharge Summary (Signed)
Physician Discharge Summary  Jeffrey Petersen XXXWitherspoon QMV:[id number] DOB: 1961-08-10 DOA: 01/25/2016  PCP: No PCP Per Patient  Admit date: 01/25/2016 Discharge date: 01/27/2016  Time spent: 45 minutes  Recommendations for Outpatient Follow-up:  -Will be discharged back into the custody of the correctional facility.   Discharge Diagnoses:  Active Problems:   Chest pain   Discharge Condition: Stable and improved  Filed Weights   01/25/16 2008 01/26/16 0021  Weight: 88.451 kg (195 lb) 87 kg (191 lb 12.8 oz)    History of present illness:  As per Dr. Sharl MaLama on [phone]: 55 year old male who  has a past medical history of Hypertension; Cardiomyopathy (HCC); and Asthma. patient is currently in prison. Today was brought to the hospital for chest pain which started on Wednesday. Patient says that the pain was continuous and this morning it became worse and felt like somebody was sitting on the chest. He also reported radiation to the left arm. Denies nausea vomiting or diarrhea. No shortness of breath. In the ED pain relieved after patient got fentanyl. Denies fever, no dysuria or urgency frequency of urination. Patient says he has a history of cardiopathy also had cardiac cath done 10 years ago which was normal. In the ED he was found to have mild elevation of troponin 0.08, [facility] was consulted by the ED physician. And he recommended to [facility] the [facility] at [facility] at this time. No heparin or Lovenox recommended.  Hospital Course:   Chest Pain -CP is somewhat atypical. He does have inferior q waves and lateral ST depressions on EKG. -ECHO: EF 65-70%, grade 1 diast dysfunction with severe LVH. -Has hypertensive cardiomyopathy. -Cardiology does not believe further testing is required at this time.  HTN -Well controlled on norvasc.  H/o Asthma -Does not appear to be exacerbated at this time.  Procedures:  None    Consultations:  Cardiology  Discharge Instructions  Discharge Instructions    Diet - low sodium heart healthy    Complete by:  As directed      Increase activity slowly    Complete by:  As directed             Medication List    TAKE these medications        albuterol 108 (90 Base) MCG/ACT inhaler  Commonly known as:  PROVENTIL HFA;VENTOLIN HFA  Inhale 1 puff into the lungs every 6 (six) hours as needed for wheezing or shortness of breath.     amLODipine 5 MG tablet  Commonly known as:  NORVASC  Take 5 mg by mouth daily.     beclomethasone 40 MCG/ACT inhaler  Commonly known as:  QVAR  Inhale 1 puff into the lungs 2 (two) times daily.     ranitidine 150 MG tablet  Commonly known as:  ZANTAC  Take 1 tablet (150 mg total) by mouth 2 (two) times daily.       No Known Allergies    The results of significant diagnostics from this hospitalization (including imaging, microbiology, ancillary and laboratory) are listed below for reference.    Significant Diagnostic Studies: Dg Chest 2 View  01/25/2016  CLINICAL DATA:  Chest pain EXAM: CHEST  2 VIEW COMPARISON:  None. FINDINGS: The lungs are clear wiithout focal pneumonia, edema, pneumothorax or pleural effusion. Cardiopericardial silhouette is at upper limits of normal for size. The visualized bony structures of the thorax are intact. Telemetry leads overlie the chest. IMPRESSION: No active cardiopulmonary disease. Electronically  Signed   By: Kennith Center M.D.   On: 01/25/2016 21:07    Microbiology: Recent Results (from the past 240 hour(s))  MRSA PCR Screening     Status: None   Collection Time: 01/26/16  1:22 AM  Result Value Ref Range Status   MRSA by PCR NEGATIVE NEGATIVE Final    Comment:        The GeneXpert MRSA Assay (FDA approved for NASAL specimens only), is one component of a comprehensive MRSA colonization surveillance program. It is not intended to diagnose MRSA infection nor to guide or monitor  treatment for MRSA infections.      Labs: Basic Metabolic Panel:  Recent Labs Lab 01/25/16 2020  NA 139  K 4.6  CL 105  CO2 28  GLUCOSE 97  BUN 17  CREATININE 1.29*  CALCIUM 9.4   Liver Function Tests:  Recent Labs Lab 01/25/16 2020  AST 25  ALT 17  ALKPHOS 46  BILITOT 0.5  PROT 7.2  ALBUMIN 4.5   No results for input(s): LIPASE, AMYLASE in the last 168 hours. No results for input(s): AMMONIA in the last 168 hours. CBC:  Recent Labs Lab 01/25/16 2020  WBC 4.8  HGB 15.1  HCT 46.3  MCV 84.2  PLT 159   Cardiac Enzymes:  Recent Labs Lab 01/25/16 2020 01/26/16 0151 01/26/16 0654 01/26/16 1325  TROPONINI 0.08* 0.08* 0.07* 0.07*   BNP: BNP (last 3 results)  Recent Labs  01/26/16 1329  BNP 65.0    ProBNP (last 3 results) No results for input(s): PROBNP in the last 8760 hours.  CBG: No results for input(s): GLUCAP in the last 168 hours.     SignedChaya Jan  [facility] Pager: [phone] 01/27/2016, 2:56 PM

## 2016-01-27 NOTE — Progress Notes (Signed)
Discharge with prison guard

## 2016-01-27 NOTE — Care Management (Signed)
Spoke with [place] [facility]ice at [employer]. Stated inmates [phone]. [facility], [facility] [phone].

## 2016-01-27 NOTE — Progress Notes (Signed)
Report called to [phone] Lead nurse Ms Anne HahnWillis.  All questions answered rto Ms Anne HahnWillis satisfaction

## 2016-12-26 IMAGING — DX DG CHEST 2V
2 series · 2 of 2 positions shown · non-contrast
Comparison: None.

CLINICAL DATA: Chest pain

EXAM:
CHEST  2 VIEW

[chest pa]
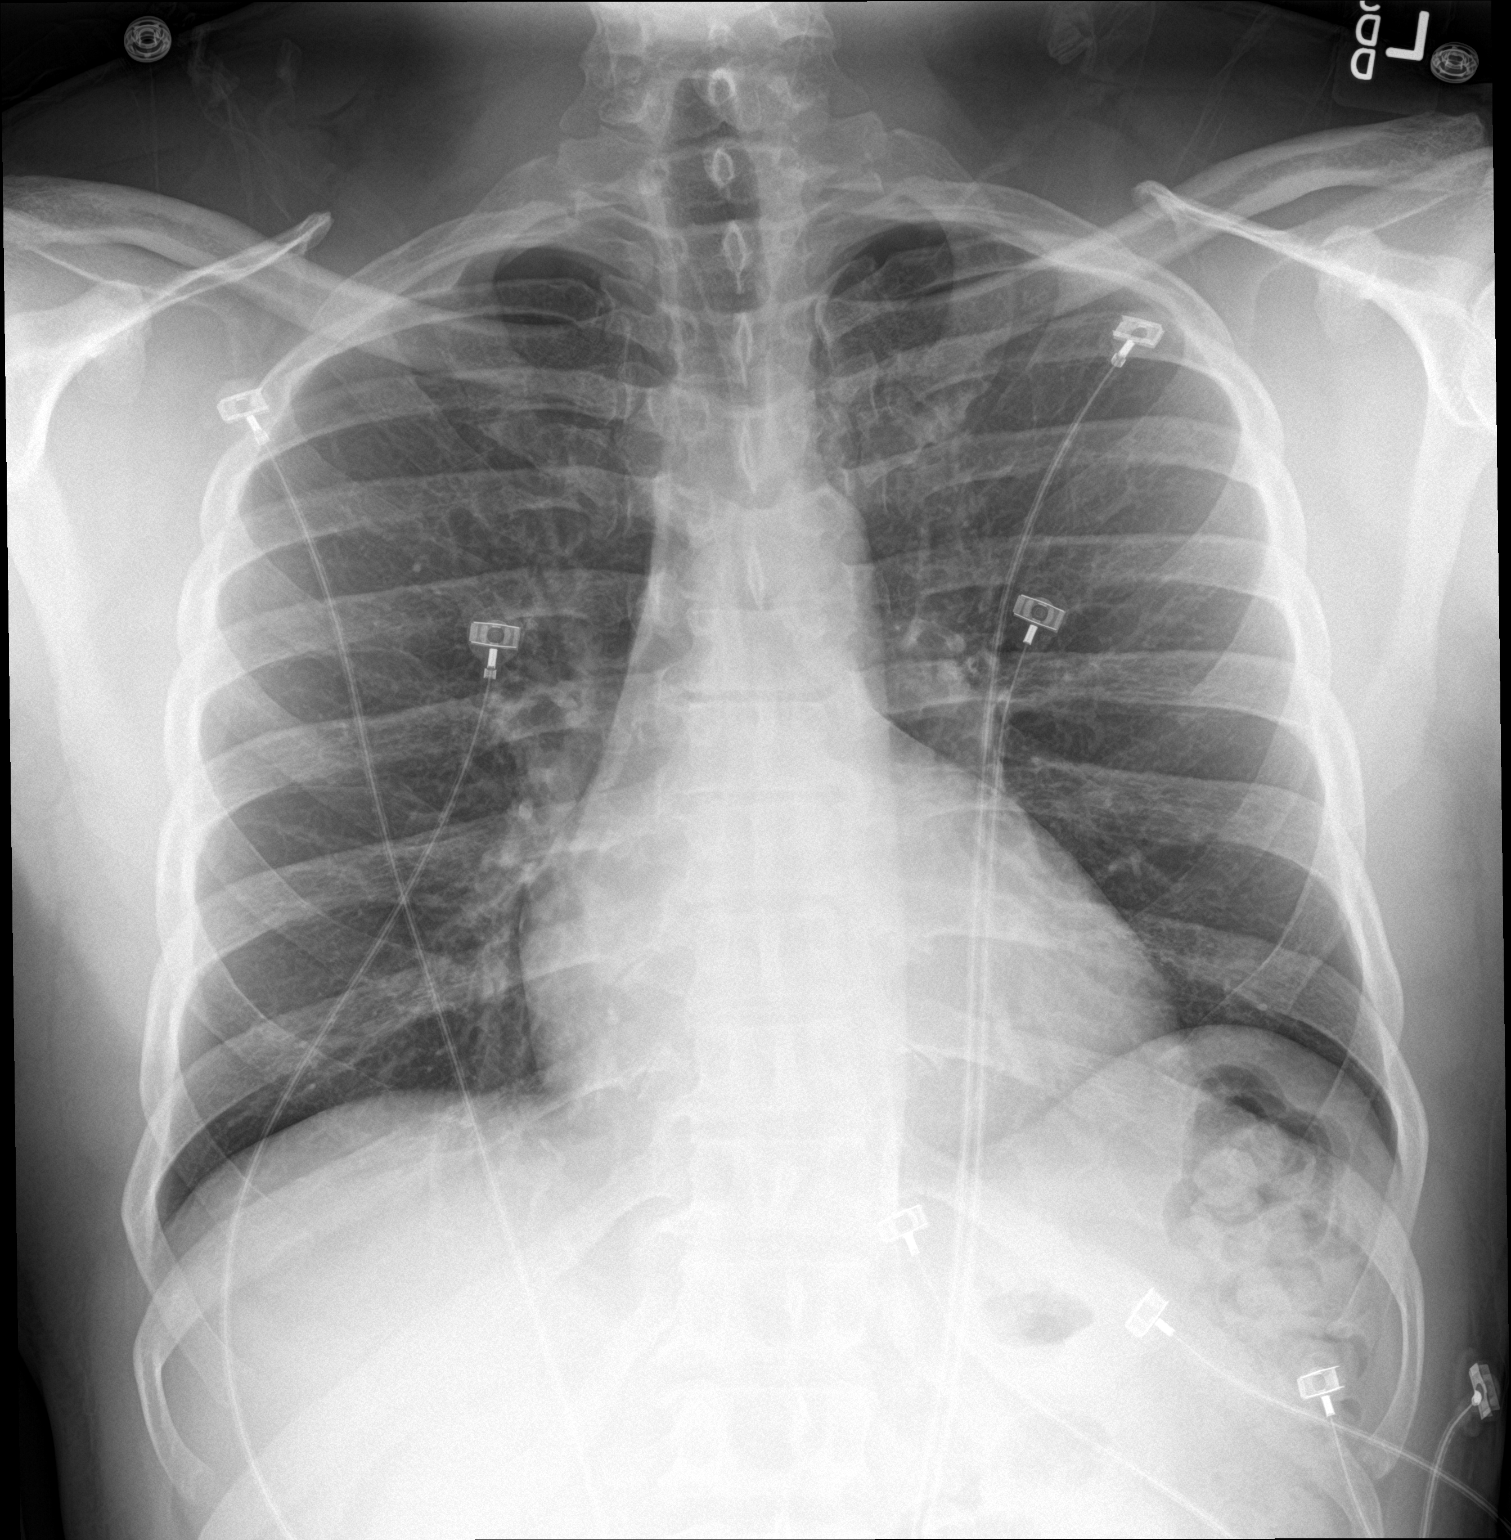

[chest lat]
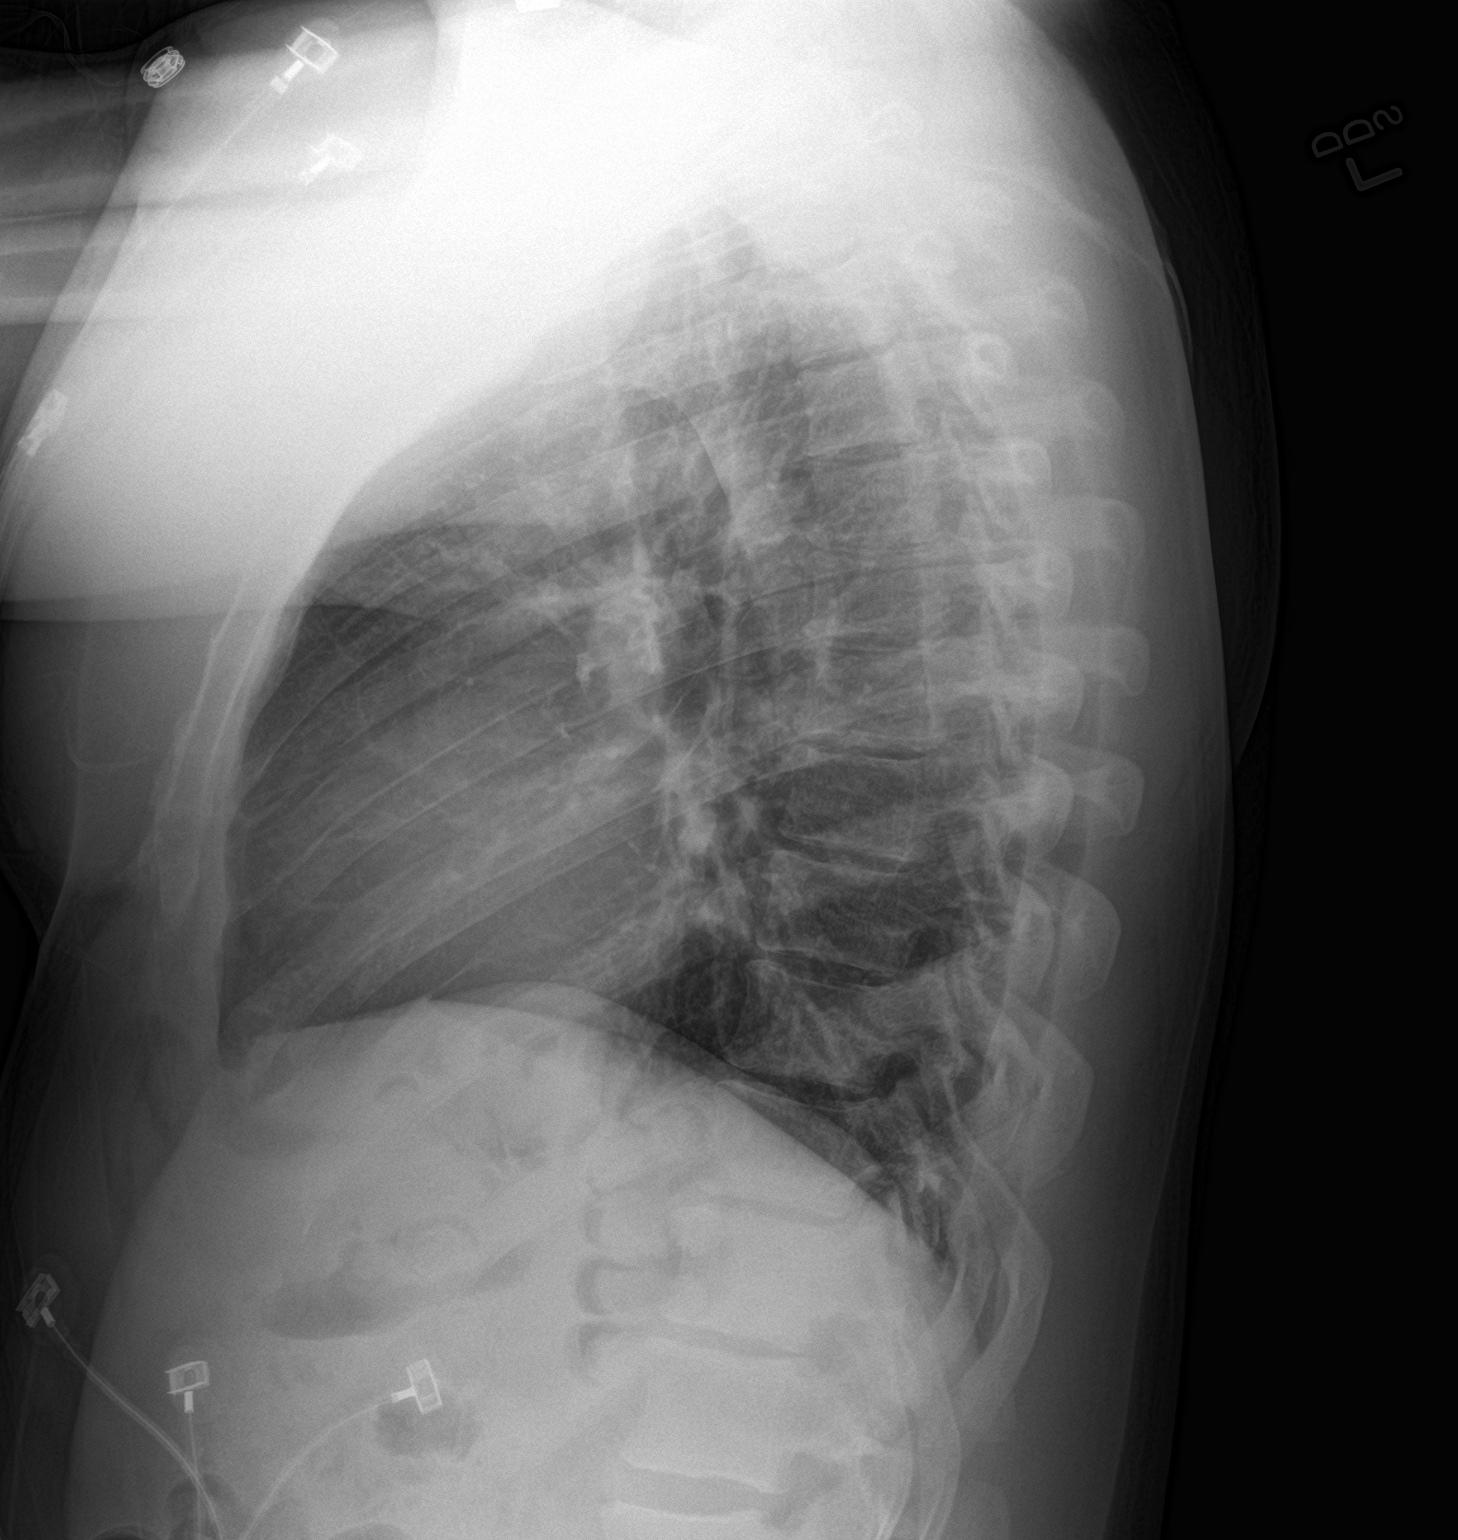

[2 of 2 positions shown; findings below may reference images not displayed]

FINDINGS: The lungs are clear wiithout focal pneumonia, edema, pneumothorax or
pleural effusion. Cardiopericardial silhouette is at upper limits of
normal for size. The visualized bony structures of the thorax are
intact. Telemetry leads overlie the chest.
IMPRESSION: No active cardiopulmonary disease.
